# Patient Record
Sex: Female | Born: 1955 | Race: White | Hispanic: No | Marital: Married
Health system: Southern US, Community
[De-identification: ages and names within clinical notes are randomized; demographics above are authoritative.]

---

## 1998-09-22 ENCOUNTER — Other Ambulatory Visit: Admission: RE | Admit: 1998-09-22 | Discharge: 1998-09-22 | Payer: Self-pay | Admitting: Family Medicine

## 2001-03-09 ENCOUNTER — Encounter: Admission: RE | Admit: 2001-03-09 | Discharge: 2001-03-09 | Payer: Self-pay | Admitting: Family Medicine

## 2001-03-09 ENCOUNTER — Encounter: Payer: Self-pay | Admitting: Family Medicine

## 2002-05-08 ENCOUNTER — Encounter: Payer: Self-pay | Admitting: Family Medicine

## 2002-05-08 ENCOUNTER — Encounter: Admission: RE | Admit: 2002-05-08 | Discharge: 2002-05-08 | Payer: Self-pay | Admitting: Family Medicine

## 2012-04-14 ENCOUNTER — Other Ambulatory Visit: Payer: Self-pay | Admitting: Obstetrics & Gynecology

## 2015-05-12 ENCOUNTER — Other Ambulatory Visit: Payer: Self-pay | Admitting: Family Medicine

## 2015-05-12 ENCOUNTER — Ambulatory Visit
Admission: RE | Admit: 2015-05-12 | Discharge: 2015-05-12 | Disposition: A | Discharge status: Home / Self Care | Payer: BC Managed Care – PPO | Source: Ambulatory Visit | Attending: Family Medicine | Admitting: Family Medicine

## 2015-05-12 DIAGNOSIS — R0602 Shortness of breath: Secondary | ICD-10-CM

## 2015-05-23 ENCOUNTER — Other Ambulatory Visit: Payer: Self-pay | Admitting: Family Medicine

## 2015-05-23 DIAGNOSIS — R911 Solitary pulmonary nodule: Secondary | ICD-10-CM

## 2015-05-29 ENCOUNTER — Ambulatory Visit
Admission: RE | Admit: 2015-05-29 | Discharge: 2015-05-29 | Disposition: A | Discharge status: Home / Self Care | Payer: BC Managed Care – PPO | Source: Ambulatory Visit | Attending: Family Medicine | Admitting: Family Medicine

## 2015-05-29 DIAGNOSIS — R911 Solitary pulmonary nodule: Secondary | ICD-10-CM

## 2016-08-04 ENCOUNTER — Other Ambulatory Visit: Payer: Self-pay | Admitting: Family Medicine

## 2016-08-04 DIAGNOSIS — R911 Solitary pulmonary nodule: Secondary | ICD-10-CM

## 2016-08-11 ENCOUNTER — Ambulatory Visit
Admission: RE | Admit: 2016-08-11 | Discharge: 2016-08-11 | Disposition: A | Discharge status: Home / Self Care | Payer: BC Managed Care – PPO | Source: Ambulatory Visit | Attending: Family Medicine | Admitting: Family Medicine

## 2016-08-11 DIAGNOSIS — R911 Solitary pulmonary nodule: Secondary | ICD-10-CM

## 2018-07-21 ENCOUNTER — Other Ambulatory Visit: Payer: Self-pay | Admitting: Obstetrics and Gynecology

## 2018-07-21 DIAGNOSIS — N6489 Other specified disorders of breast: Secondary | ICD-10-CM

## 2018-07-27 ENCOUNTER — Other Ambulatory Visit: Payer: Self-pay | Admitting: Obstetrics and Gynecology

## 2018-07-27 DIAGNOSIS — R928 Other abnormal and inconclusive findings on diagnostic imaging of breast: Secondary | ICD-10-CM

## 2018-07-28 ENCOUNTER — Ambulatory Visit: Payer: BC Managed Care – PPO

## 2018-07-28 ENCOUNTER — Ambulatory Visit
Admission: RE | Admit: 2018-07-28 | Discharge: 2018-07-28 | Disposition: A | Discharge status: Home / Self Care | Payer: BC Managed Care – PPO | Source: Ambulatory Visit | Attending: Obstetrics and Gynecology | Admitting: Obstetrics and Gynecology

## 2018-07-28 DIAGNOSIS — R928 Other abnormal and inconclusive findings on diagnostic imaging of breast: Secondary | ICD-10-CM

## 2019-08-22 ENCOUNTER — Ambulatory Visit: Payer: BC Managed Care – PPO | Attending: Internal Medicine

## 2019-08-22 DIAGNOSIS — Z23 Encounter for immunization: Secondary | ICD-10-CM | POA: Diagnosis present

## 2019-08-22 NOTE — Progress Notes (Signed)
   Covid-19 Vaccination Clinic  Name:  Donna Shelton    MRN: [id number] DOB: 1955/11/30  08/22/2019  Donna Shelton was observed post Covid-19 immunization for 15 minutes without incidence. She was provided with Vaccine Information Sheet and instruction to access the V-Safe system.   Donna Shelton was instructed to call 911 with any severe reactions post vaccine: Marland Kitchen Difficulty breathing  . Swelling of your face and throat  . A fast heartbeat  . A bad rash all over your body  . Dizziness and weakness    Immunizations Administered    Name Date Dose VIS Date Route   Pfizer COVID-19 Vaccine 08/22/2019  6:33 PM 0.3 mL 07/13/2019 Intramuscular   Manufacturer: [employer], [employer]   Lot: [id number]   NDC: [address]-7

## 2019-09-05 ENCOUNTER — Ambulatory Visit: Payer: BC Managed Care – PPO

## 2019-09-12 ENCOUNTER — Ambulatory Visit: Payer: BC Managed Care – PPO | Attending: Internal Medicine

## 2019-09-12 DIAGNOSIS — Z23 Encounter for immunization: Secondary | ICD-10-CM

## 2019-09-12 NOTE — Progress Notes (Signed)
   Covid-19 Vaccination Clinic  Name:  Donna Shelton    MRN: [id number] DOB: 02-Feb-1956  09/12/2019  Donna Shelton was observed post Covid-19 immunization for 15 minutes without incidence. She was provided with Vaccine Information Sheet and instruction to access the V-Safe system.   Donna Shelton was instructed to call 911 with any severe reactions post vaccine: Marland Kitchen Difficulty breathing  . Swelling of your face and throat  . A fast heartbeat  . A bad rash all over your body  . Dizziness and weakness    Immunizations Administered    Name Date Dose VIS Date Route   Pfizer COVID-19 Vaccine 09/12/2019  9:42 AM 0.3 mL 07/13/2019 Intramuscular   Manufacturer: [employer], [employer]   Lot: [id number]   NDC: [address]-5

## 2019-12-22 IMAGING — MG DIGITAL DIAGNOSTIC UNILATERAL RIGHT MAMMOGRAM WITH TOMO AND CAD
4 series · 4 of 12 positions shown · non-contrast
Comparison: Previous exam(s).

CLINICAL DATA: Possible asymmetry in the upper right breast in the
oblique projection of a recent screening mammogram.

EXAM:
DIGITAL DIAGNOSTIC UNILATERAL RIGHT MAMMOGRAM WITH CAD AND TOMO

[R ML synth-2D]
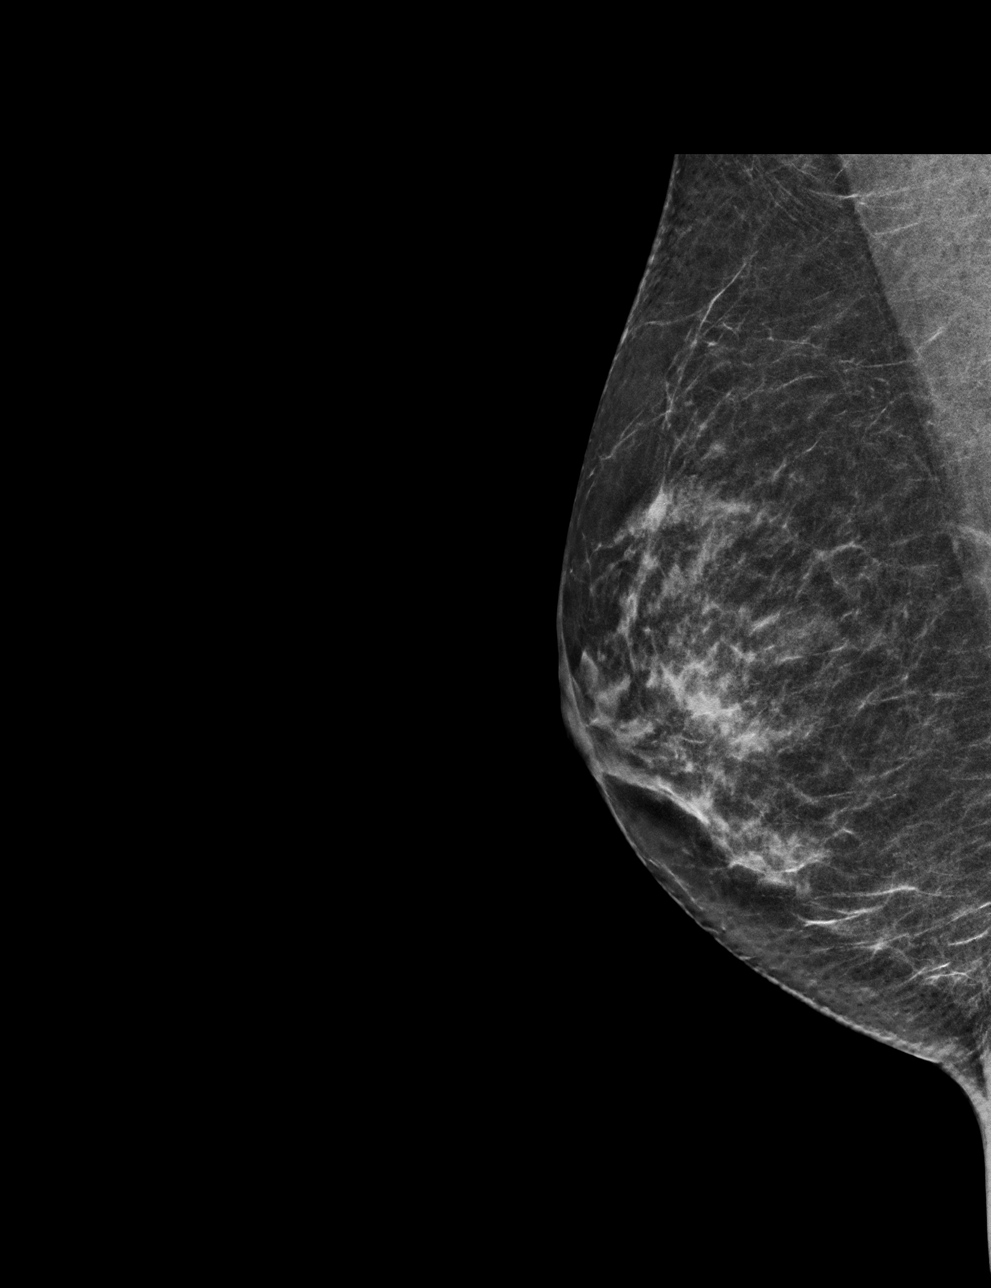

[R MLO synth-2D]
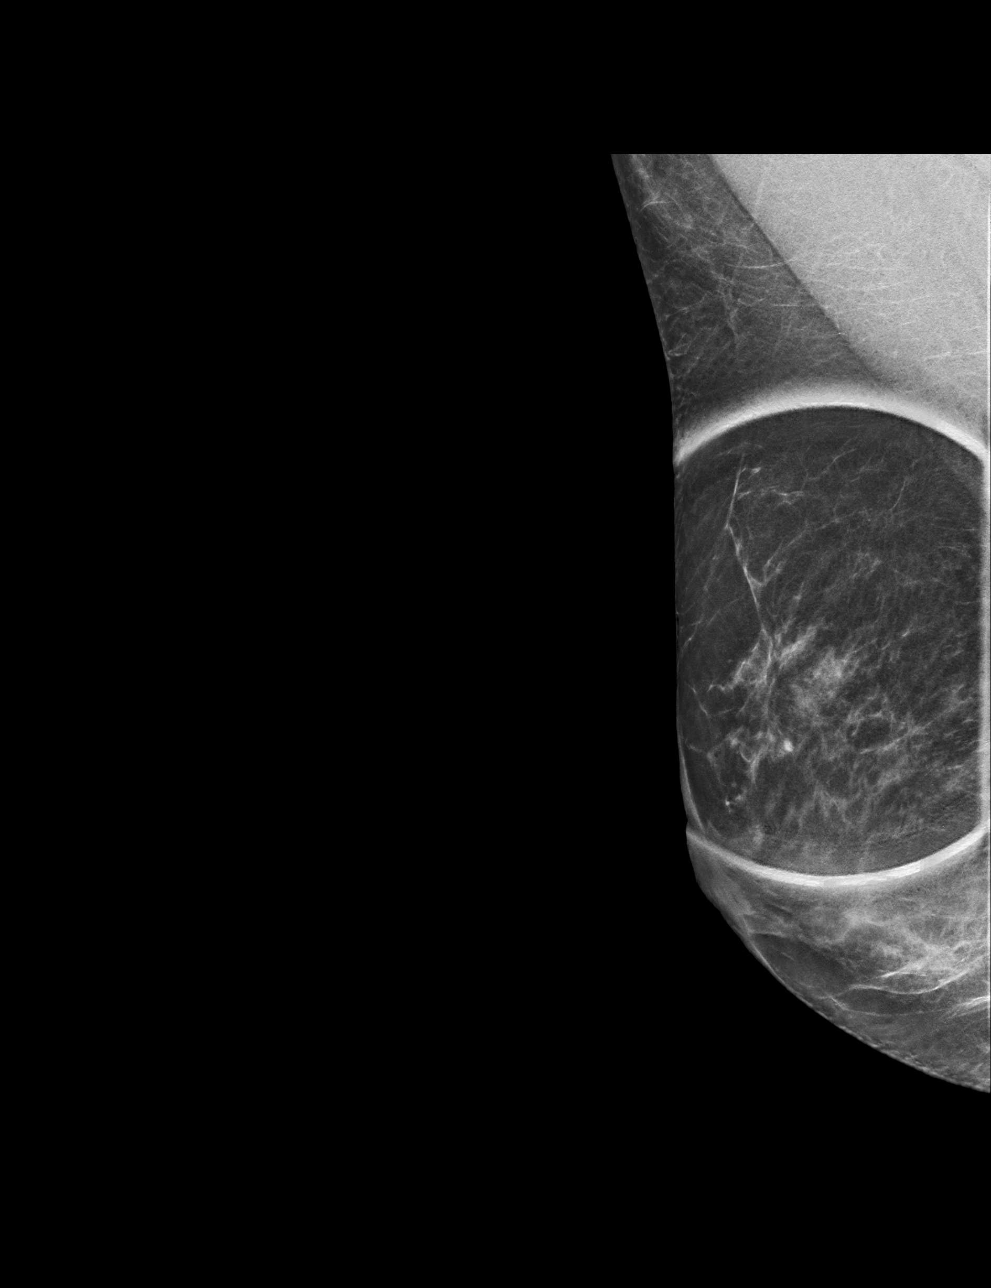

[R ML tomo · tomo slice 29/56.0]
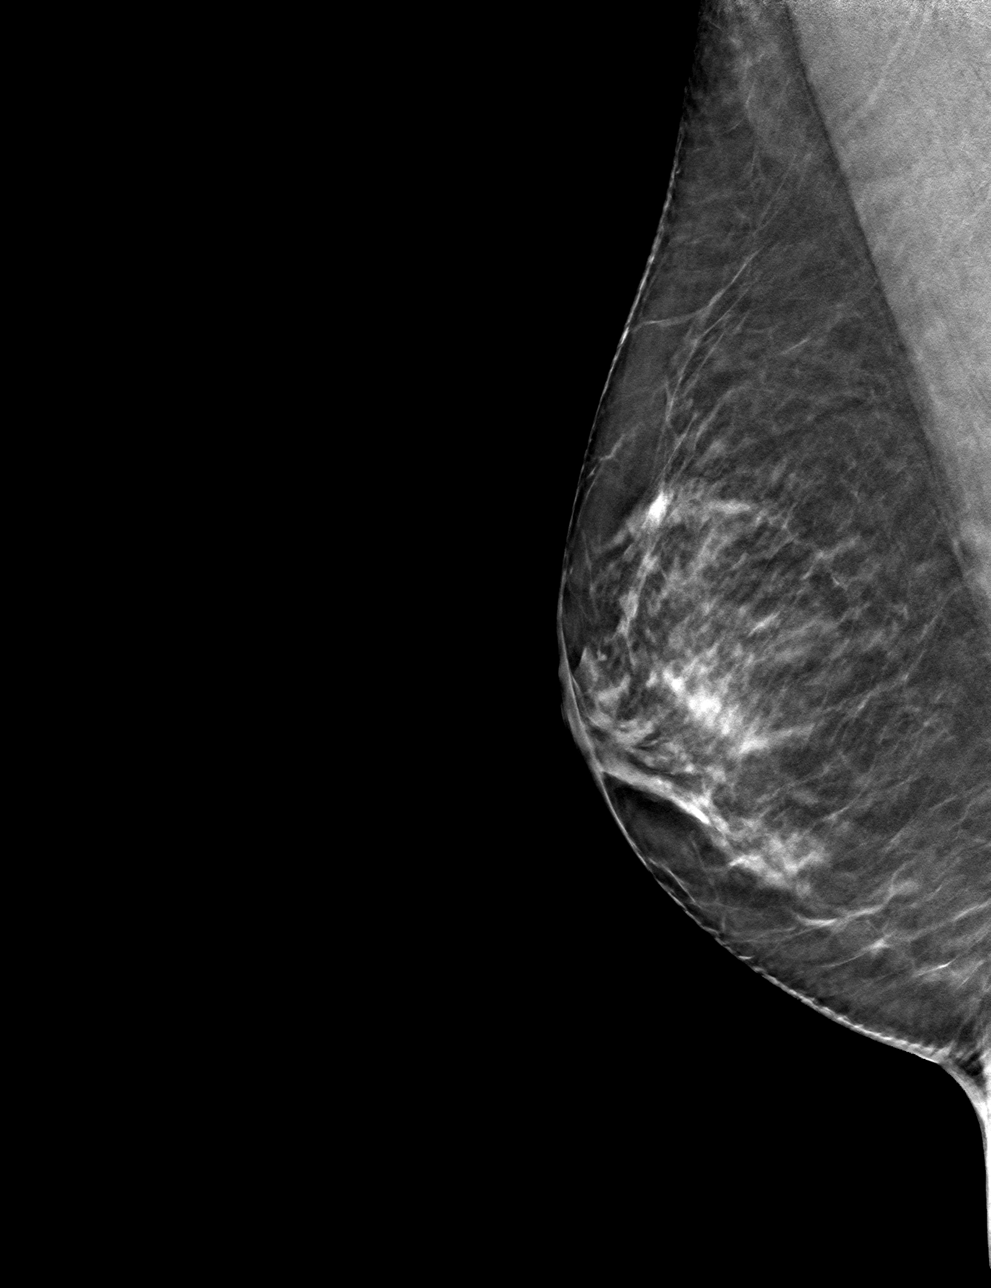

[R MLO tomo · tomo slice 26/51.0]
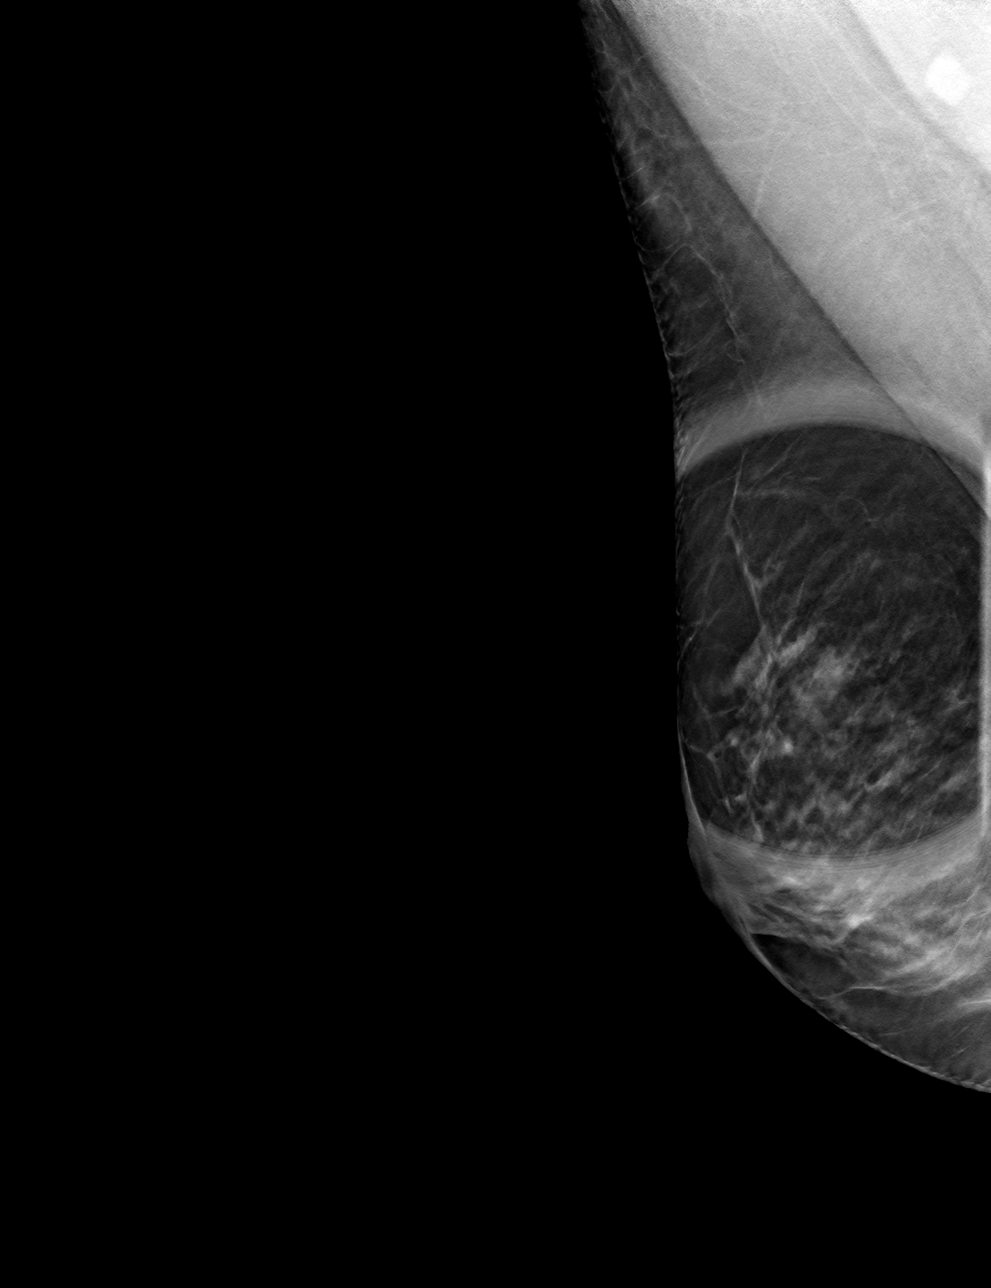

[4 of 12 positions shown; findings below may reference images not displayed]

ACR Breast Density Category b: There are scattered areas of
fibroglandular density.
FINDINGS: 3D tomographic and 2D generated true lateral and spot compression
oblique views of the right breast were obtained. These demonstrate
normal appearing breast tissue at the location of the recently
suspected asymmetry.

Mammographic images were processed with CAD.
IMPRESSION: No evidence of malignancy. The recently suspected right breast
asymmetry was close apposition of normal breast tissue.

RECOMMENDATION:
Bilateral screening mammogram in 1 year.

I have discussed the findings and recommendations with the patient.
Results were also provided in writing at the conclusion of the
visit. If applicable, a reminder letter will be sent to the patient
regarding the next appointment.

BI-RADS CATEGORY  1: Negative.

## 2020-08-07 DIAGNOSIS — J069 Acute upper respiratory infection, unspecified: Secondary | ICD-10-CM | POA: Diagnosis not present

## 2020-10-31 DIAGNOSIS — E2839 Other primary ovarian failure: Secondary | ICD-10-CM | POA: Diagnosis not present

## 2020-10-31 DIAGNOSIS — Z01419 Encounter for gynecological examination (general) (routine) without abnormal findings: Secondary | ICD-10-CM | POA: Diagnosis not present

## 2020-10-31 DIAGNOSIS — Z1231 Encounter for screening mammogram for malignant neoplasm of breast: Secondary | ICD-10-CM | POA: Diagnosis not present

## 2020-11-07 ENCOUNTER — Other Ambulatory Visit: Payer: Self-pay | Admitting: Obstetrics and Gynecology

## 2020-11-07 DIAGNOSIS — E2839 Other primary ovarian failure: Secondary | ICD-10-CM

## 2020-12-07 DIAGNOSIS — Z20822 Contact with and (suspected) exposure to covid-19: Secondary | ICD-10-CM | POA: Diagnosis not present

## 2021-02-09 DIAGNOSIS — Z0000 Encounter for general adult medical examination without abnormal findings: Secondary | ICD-10-CM | POA: Diagnosis not present

## 2021-02-09 DIAGNOSIS — Z23 Encounter for immunization: Secondary | ICD-10-CM | POA: Diagnosis not present

## 2021-02-09 DIAGNOSIS — R946 Abnormal results of thyroid function studies: Secondary | ICD-10-CM | POA: Diagnosis not present

## 2021-02-09 DIAGNOSIS — J439 Emphysema, unspecified: Secondary | ICD-10-CM | POA: Diagnosis not present

## 2021-02-09 DIAGNOSIS — M8580 Other specified disorders of bone density and structure, unspecified site: Secondary | ICD-10-CM | POA: Diagnosis not present

## 2021-02-09 DIAGNOSIS — E785 Hyperlipidemia, unspecified: Secondary | ICD-10-CM | POA: Diagnosis not present

## 2021-02-09 DIAGNOSIS — R911 Solitary pulmonary nodule: Secondary | ICD-10-CM | POA: Diagnosis not present

## 2021-02-09 DIAGNOSIS — F988 Other specified behavioral and emotional disorders with onset usually occurring in childhood and adolescence: Secondary | ICD-10-CM | POA: Diagnosis not present

## 2021-02-09 DIAGNOSIS — I2510 Atherosclerotic heart disease of native coronary artery without angina pectoris: Secondary | ICD-10-CM | POA: Diagnosis not present

## 2021-02-09 DIAGNOSIS — Z5181 Encounter for therapeutic drug level monitoring: Secondary | ICD-10-CM | POA: Diagnosis not present

## 2021-03-18 DIAGNOSIS — H524 Presbyopia: Secondary | ICD-10-CM | POA: Diagnosis not present

## 2021-03-18 DIAGNOSIS — H2513 Age-related nuclear cataract, bilateral: Secondary | ICD-10-CM | POA: Diagnosis not present

## 2021-03-18 DIAGNOSIS — H25011 Cortical age-related cataract, right eye: Secondary | ICD-10-CM | POA: Diagnosis not present

## 2021-03-18 DIAGNOSIS — H5203 Hypermetropia, bilateral: Secondary | ICD-10-CM | POA: Diagnosis not present

## 2021-03-20 DIAGNOSIS — F419 Anxiety disorder, unspecified: Secondary | ICD-10-CM | POA: Diagnosis not present

## 2021-03-20 DIAGNOSIS — R32 Unspecified urinary incontinence: Secondary | ICD-10-CM | POA: Diagnosis not present

## 2021-03-20 DIAGNOSIS — Z809 Family history of malignant neoplasm, unspecified: Secondary | ICD-10-CM | POA: Diagnosis not present

## 2021-03-20 DIAGNOSIS — Z8249 Family history of ischemic heart disease and other diseases of the circulatory system: Secondary | ICD-10-CM | POA: Diagnosis not present

## 2021-03-20 DIAGNOSIS — Z85828 Personal history of other malignant neoplasm of skin: Secondary | ICD-10-CM | POA: Diagnosis not present

## 2021-03-20 DIAGNOSIS — Z87891 Personal history of nicotine dependence: Secondary | ICD-10-CM | POA: Diagnosis not present

## 2021-03-20 DIAGNOSIS — J439 Emphysema, unspecified: Secondary | ICD-10-CM | POA: Diagnosis not present

## 2021-03-20 DIAGNOSIS — R030 Elevated blood-pressure reading, without diagnosis of hypertension: Secondary | ICD-10-CM | POA: Diagnosis not present

## 2021-05-21 ENCOUNTER — Other Ambulatory Visit: Payer: Self-pay

## 2021-05-21 ENCOUNTER — Ambulatory Visit
Admission: RE | Admit: 2021-05-21 | Discharge: 2021-05-21 | Disposition: A | Discharge status: Home / Self Care | Payer: Medicare PPO | Source: Ambulatory Visit | Attending: Obstetrics and Gynecology | Admitting: Obstetrics and Gynecology

## 2021-05-21 DIAGNOSIS — E2839 Other primary ovarian failure: Secondary | ICD-10-CM

## 2021-05-21 DIAGNOSIS — M85851 Other specified disorders of bone density and structure, right thigh: Secondary | ICD-10-CM | POA: Diagnosis not present

## 2021-05-21 DIAGNOSIS — Z780 Asymptomatic menopausal state: Secondary | ICD-10-CM | POA: Diagnosis not present

## 2021-05-21 DIAGNOSIS — M810 Age-related osteoporosis without current pathological fracture: Secondary | ICD-10-CM | POA: Diagnosis not present

## 2021-08-11 DIAGNOSIS — R41840 Attention and concentration deficit: Secondary | ICD-10-CM | POA: Diagnosis not present

## 2021-08-11 DIAGNOSIS — F330 Major depressive disorder, recurrent, mild: Secondary | ICD-10-CM | POA: Diagnosis not present

## 2021-08-11 DIAGNOSIS — I2510 Atherosclerotic heart disease of native coronary artery without angina pectoris: Secondary | ICD-10-CM | POA: Diagnosis not present

## 2021-08-11 DIAGNOSIS — J479 Bronchiectasis, uncomplicated: Secondary | ICD-10-CM | POA: Diagnosis not present

## 2021-08-11 DIAGNOSIS — F411 Generalized anxiety disorder: Secondary | ICD-10-CM | POA: Diagnosis not present

## 2021-08-11 DIAGNOSIS — Z23 Encounter for immunization: Secondary | ICD-10-CM | POA: Diagnosis not present

## 2021-08-11 DIAGNOSIS — M8580 Other specified disorders of bone density and structure, unspecified site: Secondary | ICD-10-CM | POA: Diagnosis not present

## 2021-08-11 DIAGNOSIS — Z7189 Other specified counseling: Secondary | ICD-10-CM | POA: Diagnosis not present

## 2022-02-09 DIAGNOSIS — Z0000 Encounter for general adult medical examination without abnormal findings: Secondary | ICD-10-CM | POA: Diagnosis not present

## 2022-02-09 DIAGNOSIS — J309 Allergic rhinitis, unspecified: Secondary | ICD-10-CM | POA: Diagnosis not present

## 2022-02-09 DIAGNOSIS — Z79899 Other long term (current) drug therapy: Secondary | ICD-10-CM | POA: Diagnosis not present

## 2022-02-09 DIAGNOSIS — F988 Other specified behavioral and emotional disorders with onset usually occurring in childhood and adolescence: Secondary | ICD-10-CM | POA: Diagnosis not present

## 2022-02-09 DIAGNOSIS — J439 Emphysema, unspecified: Secondary | ICD-10-CM | POA: Diagnosis not present

## 2022-02-09 DIAGNOSIS — E785 Hyperlipidemia, unspecified: Secondary | ICD-10-CM | POA: Diagnosis not present

## 2022-02-09 DIAGNOSIS — F411 Generalized anxiety disorder: Secondary | ICD-10-CM | POA: Diagnosis not present

## 2022-02-09 DIAGNOSIS — M810 Age-related osteoporosis without current pathological fracture: Secondary | ICD-10-CM | POA: Diagnosis not present

## 2022-02-09 DIAGNOSIS — I2510 Atherosclerotic heart disease of native coronary artery without angina pectoris: Secondary | ICD-10-CM | POA: Diagnosis not present

## 2022-02-19 ENCOUNTER — Other Ambulatory Visit (HOSPITAL_COMMUNITY): Payer: Self-pay | Admitting: Family Medicine

## 2022-02-19 DIAGNOSIS — E785 Hyperlipidemia, unspecified: Secondary | ICD-10-CM

## 2022-02-19 DIAGNOSIS — I2510 Atherosclerotic heart disease of native coronary artery without angina pectoris: Secondary | ICD-10-CM

## 2022-02-22 DIAGNOSIS — N952 Postmenopausal atrophic vaginitis: Secondary | ICD-10-CM | POA: Diagnosis not present

## 2022-02-22 DIAGNOSIS — Z01419 Encounter for gynecological examination (general) (routine) without abnormal findings: Secondary | ICD-10-CM | POA: Diagnosis not present

## 2022-02-23 ENCOUNTER — Telehealth (HOSPITAL_COMMUNITY): Payer: Self-pay | Admitting: Family Medicine

## 2022-02-23 NOTE — Telephone Encounter (Signed)
Called patient to schedule GXT ordered by Dr. Paulino Rily. Patient states she will call us back after she looks at there schedule due to she is out of town staying with her mother. Order will be removed from the active echo WQ and when patient calls back we will reinstate the order. Thank you

## 2022-03-22 DIAGNOSIS — H25011 Cortical age-related cataract, right eye: Secondary | ICD-10-CM | POA: Diagnosis not present

## 2022-03-22 DIAGNOSIS — H524 Presbyopia: Secondary | ICD-10-CM | POA: Diagnosis not present

## 2022-03-22 DIAGNOSIS — H5203 Hypermetropia, bilateral: Secondary | ICD-10-CM | POA: Diagnosis not present

## 2022-03-23 ENCOUNTER — Ambulatory Visit: Payer: Medicare PPO

## 2022-03-23 DIAGNOSIS — I2510 Atherosclerotic heart disease of native coronary artery without angina pectoris: Secondary | ICD-10-CM

## 2022-03-23 DIAGNOSIS — E785 Hyperlipidemia, unspecified: Secondary | ICD-10-CM

## 2022-03-23 LAB — EXERCISE TOLERANCE TEST
Angina Index: 0
Duke Treadmill Score: 10
Estimated workload: 11.7
Exercise duration (min): 10 min
Exercise duration (sec): 0 s
MPHR: 153 {beats}/min
Peak HR: 146 {beats}/min
Percent HR: 95 %
RPE: 17
Rest HR: 61 {beats}/min
ST Depression (mm): 0 mm

## 2022-05-17 DIAGNOSIS — Z1231 Encounter for screening mammogram for malignant neoplasm of breast: Secondary | ICD-10-CM | POA: Diagnosis not present

## 2022-09-24 DIAGNOSIS — E785 Hyperlipidemia, unspecified: Secondary | ICD-10-CM | POA: Diagnosis not present

## 2022-09-27 DIAGNOSIS — F330 Major depressive disorder, recurrent, mild: Secondary | ICD-10-CM | POA: Diagnosis not present

## 2022-09-27 DIAGNOSIS — J479 Bronchiectasis, uncomplicated: Secondary | ICD-10-CM | POA: Diagnosis not present

## 2022-09-27 DIAGNOSIS — F988 Other specified behavioral and emotional disorders with onset usually occurring in childhood and adolescence: Secondary | ICD-10-CM | POA: Diagnosis not present

## 2022-09-27 DIAGNOSIS — E785 Hyperlipidemia, unspecified: Secondary | ICD-10-CM | POA: Diagnosis not present

## 2023-02-15 DIAGNOSIS — D229 Melanocytic nevi, unspecified: Secondary | ICD-10-CM | POA: Diagnosis not present

## 2023-02-15 DIAGNOSIS — L814 Other melanin hyperpigmentation: Secondary | ICD-10-CM | POA: Diagnosis not present

## 2023-02-15 DIAGNOSIS — D1801 Hemangioma of skin and subcutaneous tissue: Secondary | ICD-10-CM | POA: Diagnosis not present

## 2023-02-15 DIAGNOSIS — L578 Other skin changes due to chronic exposure to nonionizing radiation: Secondary | ICD-10-CM | POA: Diagnosis not present

## 2023-02-15 DIAGNOSIS — L821 Other seborrheic keratosis: Secondary | ICD-10-CM | POA: Diagnosis not present

## 2023-02-22 DIAGNOSIS — Z79899 Other long term (current) drug therapy: Secondary | ICD-10-CM | POA: Diagnosis not present

## 2023-02-22 DIAGNOSIS — M810 Age-related osteoporosis without current pathological fracture: Secondary | ICD-10-CM | POA: Diagnosis not present

## 2023-02-22 DIAGNOSIS — E785 Hyperlipidemia, unspecified: Secondary | ICD-10-CM | POA: Diagnosis not present

## 2023-02-24 DIAGNOSIS — I2510 Atherosclerotic heart disease of native coronary artery without angina pectoris: Secondary | ICD-10-CM | POA: Diagnosis not present

## 2023-02-24 DIAGNOSIS — E785 Hyperlipidemia, unspecified: Secondary | ICD-10-CM | POA: Diagnosis not present

## 2023-02-24 DIAGNOSIS — Z0000 Encounter for general adult medical examination without abnormal findings: Secondary | ICD-10-CM | POA: Diagnosis not present

## 2023-02-24 DIAGNOSIS — Z23 Encounter for immunization: Secondary | ICD-10-CM | POA: Diagnosis not present

## 2023-02-24 DIAGNOSIS — Z9181 History of falling: Secondary | ICD-10-CM | POA: Diagnosis not present

## 2023-02-24 DIAGNOSIS — Z79899 Other long term (current) drug therapy: Secondary | ICD-10-CM | POA: Diagnosis not present

## 2023-02-24 DIAGNOSIS — I700 Atherosclerosis of aorta: Secondary | ICD-10-CM | POA: Diagnosis not present

## 2023-02-24 DIAGNOSIS — M810 Age-related osteoporosis without current pathological fracture: Secondary | ICD-10-CM | POA: Diagnosis not present

## 2023-03-15 DIAGNOSIS — M810 Age-related osteoporosis without current pathological fracture: Secondary | ICD-10-CM | POA: Diagnosis not present

## 2023-03-15 DIAGNOSIS — Z01419 Encounter for gynecological examination (general) (routine) without abnormal findings: Secondary | ICD-10-CM | POA: Diagnosis not present

## 2023-03-15 DIAGNOSIS — N952 Postmenopausal atrophic vaginitis: Secondary | ICD-10-CM | POA: Diagnosis not present

## 2023-03-18 ENCOUNTER — Other Ambulatory Visit: Payer: Self-pay | Admitting: Obstetrics and Gynecology

## 2023-03-18 DIAGNOSIS — M810 Age-related osteoporosis without current pathological fracture: Secondary | ICD-10-CM

## 2023-03-25 DIAGNOSIS — H524 Presbyopia: Secondary | ICD-10-CM | POA: Diagnosis not present

## 2023-03-25 DIAGNOSIS — H5203 Hypermetropia, bilateral: Secondary | ICD-10-CM | POA: Diagnosis not present

## 2023-03-25 DIAGNOSIS — H25813 Combined forms of age-related cataract, bilateral: Secondary | ICD-10-CM | POA: Diagnosis not present

## 2023-05-24 DIAGNOSIS — Z1231 Encounter for screening mammogram for malignant neoplasm of breast: Secondary | ICD-10-CM | POA: Diagnosis not present

## 2023-09-02 DIAGNOSIS — I2510 Atherosclerotic heart disease of native coronary artery without angina pectoris: Secondary | ICD-10-CM | POA: Diagnosis not present

## 2023-09-02 DIAGNOSIS — I10 Essential (primary) hypertension: Secondary | ICD-10-CM | POA: Diagnosis not present

## 2023-09-02 DIAGNOSIS — I700 Atherosclerosis of aorta: Secondary | ICD-10-CM | POA: Diagnosis not present

## 2023-09-02 DIAGNOSIS — E785 Hyperlipidemia, unspecified: Secondary | ICD-10-CM | POA: Diagnosis not present

## 2023-09-02 DIAGNOSIS — R41840 Attention and concentration deficit: Secondary | ICD-10-CM | POA: Diagnosis not present

## 2023-09-02 DIAGNOSIS — F330 Major depressive disorder, recurrent, mild: Secondary | ICD-10-CM | POA: Diagnosis not present

## 2023-10-26 DIAGNOSIS — D127 Benign neoplasm of rectosigmoid junction: Secondary | ICD-10-CM | POA: Diagnosis not present

## 2023-10-26 DIAGNOSIS — Z860101 Personal history of adenomatous and serrated colon polyps: Secondary | ICD-10-CM | POA: Diagnosis not present

## 2023-10-26 DIAGNOSIS — Z09 Encounter for follow-up examination after completed treatment for conditions other than malignant neoplasm: Secondary | ICD-10-CM | POA: Diagnosis not present

## 2023-10-26 DIAGNOSIS — K5730 Diverticulosis of large intestine without perforation or abscess without bleeding: Secondary | ICD-10-CM | POA: Diagnosis not present

## 2023-10-28 DIAGNOSIS — D127 Benign neoplasm of rectosigmoid junction: Secondary | ICD-10-CM | POA: Diagnosis not present

## 2023-11-28 ENCOUNTER — Ambulatory Visit
Admission: RE | Admit: 2023-11-28 | Discharge: 2023-11-28 | Disposition: A | Discharge status: Home / Self Care | Payer: Medicare PPO | Source: Ambulatory Visit | Attending: Obstetrics and Gynecology | Admitting: Obstetrics and Gynecology

## 2023-11-28 DIAGNOSIS — M8588 Other specified disorders of bone density and structure, other site: Secondary | ICD-10-CM | POA: Diagnosis not present

## 2023-11-28 DIAGNOSIS — N958 Other specified menopausal and perimenopausal disorders: Secondary | ICD-10-CM | POA: Diagnosis not present

## 2023-11-28 DIAGNOSIS — M810 Age-related osteoporosis without current pathological fracture: Secondary | ICD-10-CM

## 2024-02-13 DIAGNOSIS — L814 Other melanin hyperpigmentation: Secondary | ICD-10-CM | POA: Diagnosis not present

## 2024-02-13 DIAGNOSIS — L821 Other seborrheic keratosis: Secondary | ICD-10-CM | POA: Diagnosis not present

## 2024-02-13 DIAGNOSIS — L570 Actinic keratosis: Secondary | ICD-10-CM | POA: Diagnosis not present

## 2024-02-13 DIAGNOSIS — D1801 Hemangioma of skin and subcutaneous tissue: Secondary | ICD-10-CM | POA: Diagnosis not present

## 2024-02-13 DIAGNOSIS — D485 Neoplasm of uncertain behavior of skin: Secondary | ICD-10-CM | POA: Diagnosis not present

## 2024-02-13 DIAGNOSIS — L578 Other skin changes due to chronic exposure to nonionizing radiation: Secondary | ICD-10-CM | POA: Diagnosis not present

## 2024-02-13 DIAGNOSIS — Z411 Encounter for cosmetic surgery: Secondary | ICD-10-CM | POA: Diagnosis not present

## 2024-02-13 DIAGNOSIS — D225 Melanocytic nevi of trunk: Secondary | ICD-10-CM | POA: Diagnosis not present

## 2024-02-13 DIAGNOSIS — D229 Melanocytic nevi, unspecified: Secondary | ICD-10-CM | POA: Diagnosis not present

## 2024-02-16 ENCOUNTER — Encounter

## 2024-02-27 DIAGNOSIS — E785 Hyperlipidemia, unspecified: Secondary | ICD-10-CM | POA: Diagnosis not present

## 2024-02-27 DIAGNOSIS — Z79899 Other long term (current) drug therapy: Secondary | ICD-10-CM | POA: Diagnosis not present

## 2024-02-27 DIAGNOSIS — M810 Age-related osteoporosis without current pathological fracture: Secondary | ICD-10-CM | POA: Diagnosis not present

## 2024-02-29 DIAGNOSIS — M810 Age-related osteoporosis without current pathological fracture: Secondary | ICD-10-CM | POA: Diagnosis not present

## 2024-02-29 DIAGNOSIS — F988 Other specified behavioral and emotional disorders with onset usually occurring in childhood and adolescence: Secondary | ICD-10-CM | POA: Diagnosis not present

## 2024-02-29 DIAGNOSIS — Z0000 Encounter for general adult medical examination without abnormal findings: Secondary | ICD-10-CM | POA: Diagnosis not present

## 2024-02-29 DIAGNOSIS — Z7189 Other specified counseling: Secondary | ICD-10-CM | POA: Diagnosis not present

## 2024-02-29 DIAGNOSIS — Z79899 Other long term (current) drug therapy: Secondary | ICD-10-CM | POA: Diagnosis not present

## 2024-02-29 DIAGNOSIS — E785 Hyperlipidemia, unspecified: Secondary | ICD-10-CM | POA: Diagnosis not present

## 2024-03-15 DIAGNOSIS — A609 Anogenital herpesviral infection, unspecified: Secondary | ICD-10-CM | POA: Diagnosis not present

## 2024-03-15 DIAGNOSIS — Z01419 Encounter for gynecological examination (general) (routine) without abnormal findings: Secondary | ICD-10-CM | POA: Diagnosis not present

## 2024-03-15 DIAGNOSIS — M810 Age-related osteoporosis without current pathological fracture: Secondary | ICD-10-CM | POA: Diagnosis not present

## 2024-03-15 DIAGNOSIS — N952 Postmenopausal atrophic vaginitis: Secondary | ICD-10-CM | POA: Diagnosis not present
